# Patient Record
Sex: Female | Born: 1975 | Race: Black or African American | Hispanic: No | Marital: Single | Smoking: Current every day smoker
Health system: Southern US, Community
[De-identification: ages and names within clinical notes are randomized; demographics above are authoritative.]

## PROBLEM LIST (undated history)

## (undated) DIAGNOSIS — F419 Anxiety disorder, unspecified: Secondary | ICD-10-CM

## (undated) DIAGNOSIS — I10 Essential (primary) hypertension: Secondary | ICD-10-CM

---

## 2020-05-06 ENCOUNTER — Emergency Department (HOSPITAL_COMMUNITY)
Admission: EM | Admit: 2020-05-06 | Discharge: 2020-05-06 | Disposition: A | Discharge status: Home / Self Care | Payer: Self-pay | Attending: Emergency Medicine | Admitting: Emergency Medicine

## 2020-05-06 ENCOUNTER — Other Ambulatory Visit: Payer: Self-pay

## 2020-05-06 DIAGNOSIS — U071 COVID-19: Secondary | ICD-10-CM | POA: Insufficient documentation

## 2020-05-06 DIAGNOSIS — Z5321 Procedure and treatment not carried out due to patient leaving prior to being seen by health care provider: Secondary | ICD-10-CM | POA: Insufficient documentation

## 2020-05-06 LAB — SARS CORONAVIRUS 2 BY RT PCR (HOSPITAL ORDER, PERFORMED IN ~~LOC~~ HOSPITAL LAB): SARS Coronavirus 2: POSITIVE — AB

## 2020-05-06 MED ORDER — ACETAMINOPHEN 325 MG PO TABS
650.0000 mg | ORAL_TABLET | Freq: Once | ORAL | Status: AC
  Administered 2020-05-06: 650 mg via ORAL
  Filled 2020-05-06: qty 2

## 2020-05-06 NOTE — ED Triage Notes (Addendum)
Pt presents to ED POV. Pt c/o fever, bodyaches, HA. Pt reports that she does not have any known covid exposures but went to [place] 2w ago. Pt is not vaccinated for covid. Pt noncompliant w/ bp meds

## 2020-11-30 ENCOUNTER — Emergency Department (HOSPITAL_COMMUNITY)
Admission: EM | Admit: 2020-11-30 | Discharge: 2020-12-01 | Disposition: A | Discharge status: Home / Self Care | Payer: Self-pay | Attending: Emergency Medicine | Admitting: Emergency Medicine

## 2020-11-30 ENCOUNTER — Other Ambulatory Visit: Payer: Self-pay

## 2020-11-30 ENCOUNTER — Encounter (HOSPITAL_COMMUNITY): Payer: Self-pay

## 2020-11-30 DIAGNOSIS — R220 Localized swelling, mass and lump, head: Secondary | ICD-10-CM | POA: Insufficient documentation

## 2020-11-30 DIAGNOSIS — Z5321 Procedure and treatment not carried out due to patient leaving prior to being seen by health care provider: Secondary | ICD-10-CM | POA: Insufficient documentation

## 2020-11-30 NOTE — ED Triage Notes (Signed)
Pt c/o swelling to top lip and bottom lip on the right side. Pt states this happens every few years for her, but that the welling is worse this time. Pt denies SOB, denies trouble swallowing.

## 2021-06-15 ENCOUNTER — Other Ambulatory Visit: Payer: Self-pay

## 2021-06-15 ENCOUNTER — Encounter (HOSPITAL_BASED_OUTPATIENT_CLINIC_OR_DEPARTMENT_OTHER): Payer: Self-pay | Admitting: Emergency Medicine

## 2021-06-15 ENCOUNTER — Emergency Department (HOSPITAL_BASED_OUTPATIENT_CLINIC_OR_DEPARTMENT_OTHER)
Admission: EM | Admit: 2021-06-15 | Discharge: 2021-06-15 | Disposition: A | Discharge status: Home / Self Care | Payer: Self-pay | Attending: Emergency Medicine | Admitting: Emergency Medicine

## 2021-06-15 ENCOUNTER — Emergency Department (HOSPITAL_BASED_OUTPATIENT_CLINIC_OR_DEPARTMENT_OTHER): Payer: Self-pay

## 2021-06-15 DIAGNOSIS — M542 Cervicalgia: Secondary | ICD-10-CM | POA: Insufficient documentation

## 2021-06-15 DIAGNOSIS — R202 Paresthesia of skin: Secondary | ICD-10-CM | POA: Insufficient documentation

## 2021-06-15 DIAGNOSIS — R519 Headache, unspecified: Secondary | ICD-10-CM | POA: Insufficient documentation

## 2021-06-15 DIAGNOSIS — Z20822 Contact with and (suspected) exposure to covid-19: Secondary | ICD-10-CM | POA: Insufficient documentation

## 2021-06-15 DIAGNOSIS — R079 Chest pain, unspecified: Secondary | ICD-10-CM | POA: Insufficient documentation

## 2021-06-15 DIAGNOSIS — I10 Essential (primary) hypertension: Secondary | ICD-10-CM | POA: Insufficient documentation

## 2021-06-15 DIAGNOSIS — M79601 Pain in right arm: Secondary | ICD-10-CM | POA: Insufficient documentation

## 2021-06-15 HISTORY — DX: Essential (primary) hypertension: I10

## 2021-06-15 HISTORY — DX: Anxiety disorder, unspecified: F41.9

## 2021-06-15 LAB — COMPREHENSIVE METABOLIC PANEL
ALT: 19 U/L (ref 0–44)
AST: 20 U/L (ref 15–41)
Albumin: 4.3 g/dL (ref 3.5–5.0)
Alkaline Phosphatase: 65 U/L (ref 38–126)
Anion gap: 9 (ref 5–15)
BUN: 9 mg/dL (ref 6–20)
CO2: 27 mmol/L (ref 22–32)
Calcium: 9 mg/dL (ref 8.9–10.3)
Chloride: 98 mmol/L (ref 98–111)
Creatinine, Ser: 0.79 mg/dL (ref 0.44–1.00)
GFR, Estimated: >60 mL/min (ref >60)
Glucose, Bld: 97 mg/dL (ref 70–99)
Potassium: 3.5 mmol/L (ref 3.5–5.1)
Sodium: 134 mmol/L — ABNORMAL LOW (ref 135–145)
Total Bilirubin: 0.9 mg/dL (ref 0.3–1.2)
Total Protein: 8 g/dL (ref 6.5–8.1)

## 2021-06-15 LAB — TROPONIN I (HIGH SENSITIVITY)
Troponin I (High Sensitivity): 4 ng/L (ref <18)
Troponin I (High Sensitivity): 4 ng/L (ref <18)

## 2021-06-15 LAB — CBC
HCT: 42.2 % (ref 36.0–46.0)
Hemoglobin: 14.3 g/dL (ref 12.0–15.0)
MCH: 31.8 pg (ref 26.0–34.0)
MCHC: 33.9 g/dL (ref 30.0–36.0)
MCV: 94 fL (ref 80.0–100.0)
Platelets: 213 10*3/uL (ref 150–400)
RBC: 4.49 MIL/uL (ref 3.87–5.11)
RDW: 11.7 % (ref 11.5–15.5)
WBC: 8.3 10*3/uL (ref 4.0–10.5)
nRBC: 0 % (ref 0.0–0.2)

## 2021-06-15 LAB — RESP PANEL BY RT-PCR (FLU A&B, COVID) ARPGX2
Influenza A by PCR: NEGATIVE
Influenza B by PCR: NEGATIVE
SARS Coronavirus 2 by RT PCR: NEGATIVE

## 2021-06-15 MED ORDER — ONDANSETRON HCL 4 MG/2ML IJ SOLN
4.0000 mg | Freq: Once | INTRAMUSCULAR | Status: AC
  Administered 2021-06-15: 4 mg via INTRAVENOUS
  Filled 2021-06-15: qty 2

## 2021-06-15 MED ORDER — SODIUM CHLORIDE 0.9 % IV BOLUS
1000.0000 mL | Freq: Once | INTRAVENOUS | Status: AC
  Administered 2021-06-15: 1000 mL via INTRAVENOUS

## 2021-06-15 MED ORDER — ACETAMINOPHEN 325 MG PO TABS
650.0000 mg | ORAL_TABLET | Freq: Once | ORAL | Status: AC
  Administered 2021-06-15: 650 mg via ORAL
  Filled 2021-06-15: qty 2

## 2021-06-15 NOTE — ED Triage Notes (Signed)
Right shoulder pain and numbness with radiation down right arm x 1 week. Pt reports BP at work was [id number] systolic. CP and nausea began on the drive here. Hx of htn and anxiety. ?

## 2021-06-15 NOTE — ED Notes (Signed)
Per MD, second troponin draw held for now, will speak to pt to determine need. ?

## 2021-06-15 NOTE — ED Provider Notes (Signed)
?[facility] EMERGENCY DEPARTMENT ?Provider Note ? ? ?CSN: [id number] ?Arrival date & time: 06/15/21  1718 ? ?  ? ?History ? ?Chief Complaint  ?Patient presents with  ? Shoulder Pain  ? Numbness  ? ? ?Claire Jacobs is a 46 y.o. female. ? ?HPI ? ?46 year old female past medical history of HTN, anxiety, migraines presents emergency department with right-sided neck and upper extremity pain as well as an episode of chest pain.  Patient states for the last week she has been having right-sided neck pain that is radiating down into her right arm.  It feels like shocklike pain going down the arm.  Specifically her thumb index and middle finger feel tingly at times.  Otherwise she denies any grip strength weakness or arm weakness.  No rash.  No neck injury, no trauma to the upper extremity.  While driving here she had an episode of chest pain and felt anxious.  Currently denies any chest pain, back pain, shortness of breath.  No swelling of her lower extremities.  Patient just moved here and currently does not have a primary doctor.  She did have a headache yesterday leading into today, no history of complicated migraines. ? ?Home Medications ?Prior to Admission medications   ?Not on File  ?   ? ?Allergies    ?Patient has no known allergies.   ? ?Review of Systems   ?Review of Systems  ?Constitutional:  Negative for fever.  ?Respiratory:  Negative for chest tightness and shortness of breath.   ?Cardiovascular:  Positive for chest pain. Negative for palpitations and leg swelling.  ?Gastrointestinal:  Negative for abdominal pain, diarrhea and vomiting.  ?Musculoskeletal:  Positive for neck pain. Negative for back pain and neck stiffness.  ?     Right upper extremity radicular pain  ?Skin:  Negative for rash.  ?Neurological:  Negative for headaches.  ? ?Physical Exam ?Updated Vital Signs ?BP (!) 143/79 (BP Location: Right Arm)   Pulse 79   Temp 100 ?F (37.8 ?C) (Oral)   Resp 14   Ht 5\' 4"  (1.626 m)   Wt 79.4 kg    SpO2 99%   BMI 30.04 kg/m?  ?Physical Exam ?Vitals and nursing note reviewed.  ?Constitutional:   ?   General: She is not in acute distress. ?   Appearance: Normal appearance.  ?HENT:  ?   Head: Normocephalic.  ?   Mouth/Throat:  ?   Mouth: Mucous membranes are moist.  ?Eyes:  ?   Pupils: Pupils are equal, round, and reactive to light.  ?Cardiovascular:  ?   Rate and Rhythm: Normal rate.  ?Pulmonary:  ?   Effort: Pulmonary effort is normal. No respiratory distress.  ?Abdominal:  ?   Palpations: Abdomen is soft.  ?   Tenderness: There is no abdominal tenderness.  ?Musculoskeletal:  ?   Cervical back: No rigidity.  ?   Comments: Equal palpable radial pulses, no discoloration of the right upper extremity, no midline cervical spine tenderness to palpation, grip strength is equal and intact  ?Skin: ?   General: Skin is warm.  ?Neurological:  ?   Mental Status: She is alert and oriented to person, place, and time. Mental status is at baseline.  ?Psychiatric:     ?   Mood and Affect: Mood normal.  ? ? ?ED Results / Procedures / Treatments   ?Labs ?(all labs ordered are listed, but only abnormal results are displayed) ?Labs Reviewed  ?COMPREHENSIVE METABOLIC PANEL - Abnormal; Notable  for the following components:  ?    Result Value  ? Sodium 134 (*)   ? All other components within normal limits  ?RESP PANEL BY RT-PCR (FLU A&B, COVID) ARPGX2  ?CBC  ?PREGNANCY, URINE  ?TROPONIN I (HIGH SENSITIVITY)  ?TROPONIN I (HIGH SENSITIVITY)  ? ? ?EKG ?EKG Interpretation ? ?Date/Time:  Thursday June 15 2021 17:27:08 EDT ?Ventricular Rate:  95 ?PR Interval:  148 ?QRS Duration: 72 ?QT Interval:  344 ?QTC Calculation: 432 ?R Axis:   69 ?Text Interpretation: Normal sinus rhythm Right atrial enlargement Pulmonary disease pattern Abnormal ECG No previous ECGs available Confirmed by Coralee Pesa [phone]) on 06/15/2021 8:22:35 PM ? ?Radiology ?CT Head Wo Contrast ? ?Result Date: 06/15/2021 ?CLINICAL DATA:  Sudden severe headache EXAM: CT HEAD  WITHOUT CONTRAST TECHNIQUE: Contiguous axial images were obtained from the base of the skull through the vertex without intravenous contrast. RADIATION DOSE REDUCTION: This exam was performed according to the departmental dose-optimization program which includes automated exposure control, adjustment of the mA and/or kV according to patient size and/or use of iterative reconstruction technique. COMPARISON:  None. FINDINGS: Brain: No evidence of acute infarction, hemorrhage, hydrocephalus, extra-axial collection or mass lesion/mass effect. Vascular: No hyperdense vessel or unexpected calcification. Skull: Normal. Negative for fracture or focal lesion. Sinuses/Orbits: No acute finding. Other: None. IMPRESSION: No acute intracranial abnormalities. Electronically Signed   By: Burman Nieves M.D.   On: 06/15/2021 21:20   ? ?Procedures ?Procedures  ? ? ?Medications Ordered in ED ?Medications  ?acetaminophen (TYLENOL) tablet 650 mg (650 mg Oral Given 06/15/21 1732)  ?sodium chloride 0.9 % bolus 1,000 mL ( Intravenous Stopped 06/15/21 2252)  ?ondansetron [facility]) injection 4 mg (4 mg Intravenous Given 06/15/21 2143)  ? ? ?ED Course/ Medical Decision Making/ A&P ?  ?                        ?Medical Decision Making ?Amount and/or Complexity of Data Reviewed ?Labs: ordered. ?Radiology: ordered. ? ?Risk ?OTC drugs. ?Prescription drug management. ? ? ?46 year old female presents emergency department with right-sided neck/upper extremity pain and episode of chest pain.  Slight hypertension on arrival.  Currently no chest pain, shortness of breath or back pain.  Complaining of shocklike pain going down the right upper extremity that is reproducible in certain movements.  The arm is otherwise neurovascularly intact.  No signs of acute trauma/deformity. ? ?EKG is unremarkable, troponins are negative, blood work is reassuring.  CT of the head is unremarkable.  After medications she feels improved.  Low suspicion for ACS or emergent  issue in regards to the episode of chest pain.  The right-sided neck/arm pain sounds very musculoskeletal and is reassuring on physical exam.  Headache has resolved, most likely migraine.  Low suspicion for ICH/CVA. ? ?Patient at this time appears safe and stable for discharge and close outpatient follow up. Discharge plan and strict return to ED precautions discussed, patient verbalizes understanding and agreement. ? ? ? ? ? ? ? ?Final Clinical Impression(s) / ED Diagnoses ?Final diagnoses:  ?Right arm pain  ?Chest pain, unspecified type  ? ? ?Rx / DC Orders ?ED Discharge Orders   ? ? None  ? ?  ? ? ?  ?Rozelle Logan, DO ?06/15/21 2332 ? ?

## 2021-06-15 NOTE — Discharge Instructions (Signed)
You have been seen and discharged from the emergency department.  Your cardiac work-up was normal.  Your head CT was normal.  Your blood work showed no abnormalities.  Establish care and follow-up with orthopedics in regards to the neck/arm pain, this appears to be musculoskeletal.  Follow-up with your primary provider for further evaluation and further care. Take home medications as prescribed. If you have any worsening symptoms or further concerns for your health please return to an emergency department for further evaluation. ?

## 2021-06-15 NOTE — ED Notes (Signed)
Pt given graham crackers and water per requested OK per RN Shawn. ?

## 2021-06-15 NOTE — ED Notes (Signed)
Ambulated to bathroom with steady gait

## 2021-06-15 NOTE — ED Notes (Signed)
Pt states was prescribed b/p meds but has not taken them in 6 months.  Has recently moved to area and has not been able to establish with pmd ?

## 2021-08-07 ENCOUNTER — Emergency Department (HOSPITAL_COMMUNITY): Payer: 59

## 2021-08-07 ENCOUNTER — Encounter (HOSPITAL_COMMUNITY): Payer: Self-pay | Admitting: Emergency Medicine

## 2021-08-07 ENCOUNTER — Emergency Department (HOSPITAL_COMMUNITY)
Admission: EM | Admit: 2021-08-07 | Discharge: 2021-08-07 | Disposition: A | Discharge status: Home / Self Care | Payer: 59 | Attending: Emergency Medicine | Admitting: Emergency Medicine

## 2021-08-07 ENCOUNTER — Other Ambulatory Visit: Payer: Self-pay

## 2021-08-07 DIAGNOSIS — M25511 Pain in right shoulder: Secondary | ICD-10-CM | POA: Insufficient documentation

## 2021-08-07 DIAGNOSIS — L03113 Cellulitis of right upper limb: Secondary | ICD-10-CM | POA: Insufficient documentation

## 2021-08-07 DIAGNOSIS — I10 Essential (primary) hypertension: Secondary | ICD-10-CM | POA: Diagnosis not present

## 2021-08-07 DIAGNOSIS — M7989 Other specified soft tissue disorders: Secondary | ICD-10-CM | POA: Diagnosis not present

## 2021-08-07 DIAGNOSIS — L539 Erythematous condition, unspecified: Secondary | ICD-10-CM | POA: Insufficient documentation

## 2021-08-07 DIAGNOSIS — M25611 Stiffness of right shoulder, not elsewhere classified: Secondary | ICD-10-CM | POA: Diagnosis not present

## 2021-08-07 LAB — CBC WITH DIFFERENTIAL/PLATELET
Abs Immature Granulocytes: 0.02 10*3/uL (ref 0.00–0.07)
Basophils Absolute: 0.1 10*3/uL (ref 0.0–0.1)
Basophils Relative: 1 %
Eosinophils Absolute: 0.2 10*3/uL (ref 0.0–0.5)
Eosinophils Relative: 2 %
HCT: 40 % (ref 36.0–46.0)
Hemoglobin: 13.7 g/dL (ref 12.0–15.0)
Immature Granulocytes: 0 %
Lymphocytes Relative: 33 %
Lymphs Abs: 3.2 10*3/uL (ref 0.7–4.0)
MCH: 32.7 pg (ref 26.0–34.0)
MCHC: 34.3 g/dL (ref 30.0–36.0)
MCV: 95.5 fL (ref 80.0–100.0)
Monocytes Absolute: 0.9 10*3/uL (ref 0.1–1.0)
Monocytes Relative: 10 %
Neutro Abs: 5.2 10*3/uL (ref 1.7–7.7)
Neutrophils Relative %: 54 %
Platelets: 260 10*3/uL (ref 150–400)
RBC: 4.19 MIL/uL (ref 3.87–5.11)
RDW: 12.4 % (ref 11.5–15.5)
WBC: 9.6 10*3/uL (ref 4.0–10.5)
nRBC: 0 % (ref 0.0–0.2)

## 2021-08-07 LAB — BASIC METABOLIC PANEL
Anion gap: 6 (ref 5–15)
BUN: 11 mg/dL (ref 6–20)
CO2: 28 mmol/L (ref 22–32)
Calcium: 8.9 mg/dL (ref 8.9–10.3)
Chloride: 103 mmol/L (ref 98–111)
Creatinine, Ser: 0.78 mg/dL (ref 0.44–1.00)
GFR, Estimated: >60 mL/min (ref >60)
Glucose, Bld: 102 mg/dL — ABNORMAL HIGH (ref 70–99)
Potassium: 3.2 mmol/L — ABNORMAL LOW (ref 3.5–5.1)
Sodium: 137 mmol/L (ref 135–145)

## 2021-08-07 LAB — URINALYSIS, ROUTINE W REFLEX MICROSCOPIC
Bilirubin Urine: NEGATIVE
Glucose, UA: NEGATIVE mg/dL
Hgb urine dipstick: NEGATIVE
Ketones, ur: NEGATIVE mg/dL
Leukocytes,Ua: NEGATIVE
Nitrite: NEGATIVE
Protein, ur: NEGATIVE mg/dL
Specific Gravity, Urine: <=1.005 (ref 1.005–1.030)
pH: 5 (ref 5.0–8.0)

## 2021-08-07 LAB — SEDIMENTATION RATE: Sed Rate: 9 mm/hr (ref 0–22)

## 2021-08-07 LAB — C-REACTIVE PROTEIN: CRP: 1 mg/dL — ABNORMAL HIGH (ref <1.0)

## 2021-08-07 MED ORDER — IOHEXOL 300 MG/ML  SOLN
100.0000 mL | Freq: Once | INTRAMUSCULAR | Status: AC | PRN
  Administered 2021-08-07: 100 mL via INTRAVENOUS

## 2021-08-07 MED ORDER — CLINDAMYCIN HCL 300 MG PO CAPS: 300.0000 mg | ORAL_CAPSULE | Freq: Three times a day (TID) | ORAL | 0 refills | Status: DC

## 2021-08-07 MED ORDER — CLINDAMYCIN HCL 300 MG PO CAPS
300.0000 mg | ORAL_CAPSULE | Freq: Once | ORAL | Status: AC
  Administered 2021-08-07: 300 mg via ORAL
  Filled 2021-08-07: qty 1

## 2021-08-07 MED ORDER — ACETAMINOPHEN 325 MG PO TABS
650.0000 mg | ORAL_TABLET | Freq: Once | ORAL | Status: AC
  Administered 2021-08-07: 650 mg via ORAL
  Filled 2021-08-07: qty 2

## 2021-08-07 MED ORDER — PIPERACILLIN-TAZOBACTAM 3.375 G IVPB 30 MIN: 3.3750 g | Freq: Once | INTRAVENOUS | Status: DC

## 2021-08-07 MED ORDER — MORPHINE SULFATE (PF) 4 MG/ML IV SOLN
4.0000 mg | Freq: Once | INTRAVENOUS | Status: AC
  Administered 2021-08-07: 4 mg via INTRAVENOUS
  Filled 2021-08-07: qty 1

## 2021-08-07 NOTE — Discharge Instructions (Addendum)
Antibiotic has been sent to your pharmacy by the name of clindamycin.  We may take this once every 8 hours for the next week.  Always take with food and plenty of water. ? ?You may continue to manage the pain with Tylenol and ibuprofen at the same time.  These are not medications in the same class, and can be taken together. ? ?You have been provided with the [facility] office information to schedule an appointment with Dr. August Saucer.  Please call to be seen within the next 3 to 5 days for reevaluation and continued medical management. ? ?Return to the ED for new or worsening symptoms as discussed ?

## 2021-08-07 NOTE — ED Triage Notes (Signed)
Pt reports right shoulder pain x 1 month. Pt reports shoulder in swollen and hurts to touch.  ?

## 2021-08-07 NOTE — ED Notes (Signed)
I provided reinforced discharge education based off of discharge instructions. Pt acknowledged and understood my education. Pt had no further questions/concerns for provider/myself.  °

## 2021-08-07 NOTE — ED Notes (Signed)
Pt advises being unable to provide urine sample at this time, will monitor. 

## 2021-08-07 NOTE — ED Provider Notes (Signed)
?[facility] DEPT ?Provider Note ? ? ?CSN: [id number] ?Arrival date & time: 08/07/21  1633 ? ?  ? ?History ? ?Chief Complaint  ?Patient presents with  ? Shoulder Pain  ? ? ?Claire Jacobs is a 46 y.o. female with chief complaint of worsening right shoulder pain.  Seen in the ED 06/15/2021 for right arm pain.  Has had this on and off since that visit.  Has been increasing over the last 3 weeks.  Yesterday the joint started to become swollen, red, and warm to the touch.  Denies numbness and tingling of the upper extremities.  Endorses reduced range of motion of the right shoulder.  Denies chest pain, shortness of breath, fever, urinary symptoms, recent infection.  Hx of HTN, anxiety, migraines.  No Hx of gout, septic arthritis, reactive arthritis, or SLE.  No recent Hx of trauma. ? ?The history is provided by the patient and medical records.  ?Shoulder Pain ? ?  ? ?Home Medications ?Prior to Admission medications   ?Medication Sig Start Date End Date Taking? Authorizing Provider  ?clindamycin (CLEOCIN) 300 MG capsule Take 1 capsule (300 mg total) by mouth 3 (three) times daily for 7 days. X 7 days 08/07/21 08/14/21 Yes Prince Rome, PA-C  ?   ? ?Allergies    ?Patient has no known allergies.   ? ?Review of Systems   ?Review of Systems  ?Musculoskeletal:   ?     Pain and swelling of right shoulder  ?Skin:   ?     Warmth of right shoulder  ? ?Physical Exam ?Updated Vital Signs ?BP (!) 151/104 (BP Location: Left Arm)   Pulse 70   Temp 97.9 ?F (36.6 ?C) (Oral)   Resp 18   SpO2 100%  ?Physical Exam ?Vitals and nursing note reviewed.  ?Constitutional:   ?   General: She is not in acute distress. ?   Appearance: Normal appearance. She is well-developed. She is not ill-appearing or diaphoretic.  ?HENT:  ?   Head: Normocephalic and atraumatic.  ?   Nose: Nose normal.  ?Eyes:  ?   General: No scleral icterus. ?   Conjunctiva/sclera: Conjunctivae normal.  ?Cardiovascular:  ?   Rate and Rhythm:  Normal rate and regular rhythm.  ?   Pulses: Normal pulses.  ?   Heart sounds: Normal heart sounds. No murmur heard. ?Pulmonary:  ?   Effort: Pulmonary effort is normal. No respiratory distress.  ?   Breath sounds: Normal breath sounds.  ?Chest:  ?   Chest wall: No tenderness.  ?Abdominal:  ?   Palpations: Abdomen is soft.  ?   Tenderness: There is no abdominal tenderness.  ?Musculoskeletal:     ?   General: Swelling and tenderness present. No signs of injury.  ?   Right shoulder: Swelling and tenderness present. No deformity, laceration or crepitus. Decreased range of motion. Normal pulse.  ?   Left shoulder: Normal.  ?   Cervical back: Neck supple. No rigidity or tenderness.  ?   Right lower leg: No edema.  ?   Left lower leg: No edema.  ?Skin: ?   General: Skin is warm and dry.  ?   Capillary Refill: Capillary refill takes less than 2 seconds.  ?   Coloration: Skin is not jaundiced.  ?   Findings: Erythema present. No rash.  ?Neurological:  ?   Mental Status: She is alert and oriented to person, place, and time.  ?Psychiatric:     ?  Mood and Affect: Mood normal.  ? ? ?ED Results / Procedures / Treatments   ?Labs ?(all labs ordered are listed, but only abnormal results are displayed) ?Labs Reviewed  ?BASIC METABOLIC PANEL - Abnormal; Notable for the following components:  ?    Result Value  ? Potassium 3.2 (*)   ? Glucose, Bld 102 (*)   ? All other components within normal limits  ?CBC WITH DIFFERENTIAL/PLATELET  ?SEDIMENTATION RATE  ?URINALYSIS, ROUTINE W REFLEX MICROSCOPIC  ?C-REACTIVE PROTEIN  ? ? ?EKG ?None ? ?Radiology ?DG Shoulder Right ? ?Result Date: 08/07/2021 ?CLINICAL DATA:  Shoulder pain with warmth and swelling EXAM: RIGHT SHOULDER - 2+ VIEW COMPARISON:  None Available. FINDINGS: There is no evidence of fracture or dislocation. There is no evidence of arthropathy or other focal bone abnormality. Soft tissues are unremarkable. IMPRESSION: Negative. Electronically Signed   By: Donavan Foil M.D.   On:  08/07/2021 19:34  ? ?CT SHOULDER RIGHT W CONTRAST ? ?Result Date: 08/07/2021 ?CLINICAL DATA:  Right shoulder pain for 1 month. Shoulder swelling with tenderness. Soft tissue infection suspected. EXAM: CT OF THE UPPER RIGHT EXTREMITY WITH CONTRAST TECHNIQUE: Multidetector CT imaging of the right shoulder was performed according to the standard protocol following intravenous contrast administration. RADIATION DOSE REDUCTION: This exam was performed according to the departmental dose-optimization program which includes automated exposure control, adjustment of the mA and/or kV according to patient size and/or use of iterative reconstruction technique. CONTRAST:  126m OMNIPAQUE IOHEXOL 300 MG/ML  SOLN COMPARISON:  Radiographs same date FINDINGS: Bones/Joint/Cartilage No evidence of acute fracture, dislocation, bone destruction or erosive change. There are mild degenerative changes at the acromioclavicular joint. Small nonspecific shoulder joint effusion without suspicious synovial enhancement. The right clavicle and scapula appear normal. The right sternoclavicular joint appears normal. Mild cervical spondylosis without high-grade foraminal narrowing. Ligaments Suboptimally assessed by CT. Muscles and Tendons The rotator cuff musculature appears normal, without atrophy, fluid collection or abnormal enhancement. Soft tissues No periarticular fluid collection, foreign body or soft tissue emphysema. The vascular structures appear unremarkable. No axillary adenopathy. IMPRESSION: 1. No acute osseous findings or evidence of shoulder joint infection by CT. Small nonspecific shoulder joint effusion. 2. No evidence of periarticular fluid collection or inflammation. 3. The right shoulder muscles appear unremarkable. Electronically Signed   By: WRichardean SaleM.D.   On: 08/07/2021 20:54   ? ?Procedures ?Procedures  ? ? ?Medications Ordered in ED ?Medications  ?acetaminophen (TYLENOL) tablet 650 mg (650 mg Oral Given 08/07/21 1959)   ?morphine (PF) 4 MG/ML injection 4 mg (4 mg Intravenous Given 08/07/21 2023)  ?iohexol (OMNIPAQUE) 300 MG/ML solution 100 mL (100 mLs Intravenous Contrast Given 08/07/21 2026)  ?clindamycin (CLEOCIN) capsule 300 mg (300 mg Oral Given 08/07/21 2146)  ? ? ?ED Course/ Medical Decision Making/ A&P ?  ?                        ?Medical Decision Making ?Amount and/or Complexity of Data Reviewed ?External Data Reviewed: notes. ?Labs: ordered. Decision-making details documented in ED Course. ?Radiology: ordered and independent interpretation performed. Decision-making details documented in ED Course. ?ECG/medicine tests: ordered and independent interpretation performed. Decision-making details documented in ED Course. ? ?Risk ?OTC drugs. ?Prescription drug management. ? ? ?46y.o. female presents to the ED for concern of Shoulder Pain ?  ?This involves an extensive number of treatment options, and is a complaint that carries with it a high risk of complications and morbidity.  The  emergent differential diagnosis prior to evaluation includes, but is not limited to: Fracture, dislocation, contusion, effusion, septic arthritis, gout, pseudogout, reactive arthritis ? ?This is not an exhaustive differential.  ? ?Past Medical History / Co-morbidities / Social History: ?HTN, anxiety, migraines ? ?Additional History:  ?Internal and external records from outside source obtained and reviewed including ED visits ? ?Physical Exam: ?Physical exam performed. The pertinent findings include: Erythema, warmth, swelling of the right shoulder joint. ? ?Lab Tests: ?I ordered, and personally interpreted labs.  The pertinent results include:   ?CBC: Unremarkable ?CMP/BMP: Unremarkable ?CRP: Pending ?ESR: Negative ?Urinalysis: Unremarkable ? ?Imaging Studies: ?I ordered imaging studies including x-ray and CT right shoulder.  I independently visualized and interpreted said imaging.  Pertinent results described in ED course. ? ?Medications: ?I ordered  medication including Tylenol and morphine for pain relief, and clindamycin for antibiotic therapy.  Reevaluation of the patient after these medicines showed that the patient mild-moderate relief.  I have revie

## 2021-08-07 NOTE — ED Notes (Signed)
Pt advised attempting to void multiple times without success. ?

## 2021-08-10 ENCOUNTER — Telehealth (HOSPITAL_COMMUNITY): Payer: Self-pay | Admitting: Medical

## 2021-08-10 MED ORDER — CLINDAMYCIN HCL 300 MG PO CAPS: 300.0000 mg | ORAL_CAPSULE | Freq: Three times a day (TID) | ORAL | 0 refills | Status: AC

## 2021-08-10 MED ORDER — CLINDAMYCIN HCL 300 MG PO CAPS: 300.0000 mg | ORAL_CAPSULE | Freq: Three times a day (TID) | ORAL | 0 refills | Status: DC

## 2021-08-16 ENCOUNTER — Ambulatory Visit: Payer: 59 | Admitting: Surgery

## 2021-08-24 ENCOUNTER — Telehealth: Payer: Self-pay | Admitting: Orthopedic Surgery

## 2021-08-24 ENCOUNTER — Encounter: Payer: Self-pay | Admitting: Surgery

## 2021-08-24 ENCOUNTER — Ambulatory Visit (INDEPENDENT_AMBULATORY_CARE_PROVIDER_SITE_OTHER): Payer: 59

## 2021-08-24 ENCOUNTER — Ambulatory Visit (INDEPENDENT_AMBULATORY_CARE_PROVIDER_SITE_OTHER): Payer: 59 | Admitting: Surgery

## 2021-08-24 VITALS — BP 176/123 | HR 67 | Temp 98.0°F

## 2021-08-24 DIAGNOSIS — M25511 Pain in right shoulder: Secondary | ICD-10-CM

## 2021-08-24 NOTE — Telephone Encounter (Signed)
Looks like a more urgent thing, I would work her in for after the scan next week

## 2021-08-24 NOTE — Telephone Encounter (Signed)
"  Return in about 1 week (around 08/31/2021) for with dr dean to review right shoulder MRI." Dr does not have a spot open until the 21st of June does she need to be worked in or can this wait.

## 2021-08-24 NOTE — Progress Notes (Signed)
Office Visit Note   Patient: Claire Jacobs           Date of Birth: 1975/08/13           MRN: [id number] Visit Date: 08/24/2021              Requested by: No referring provider defined for this encounter. PCP: Pcp, No   Assessment & Plan: Visit Diagnoses:  1. Acute pain of right shoulder   Previous right shoulder cellulitis diagnosis in the emergency department Aug 07, 2021  Plan: I discussed patient with Dr. Marlou Sa today.  With her ongoing pain I will get an MRI right shoulder to rule out infection.  Blood work also drawn to check a CBC with differential, sed rate, arthritis panel.  Follow-up with Dr. Marlou Sa in 1 week to review studies.  Patient understands if her pain worsens over the weekend that she should again go to the emergency room for evaluation.  Blood pressure in office today 176/123.  Patient also advised to get a primary care provider.  Follow-Up Instructions: Return in about 1 week (around 08/31/2021) for with dr dean to review right shoulder MRI.   Orders:  Orders Placed This Encounter  Procedures   XR Shoulder 1V Right   MR SHOULDER RIGHT WO CONTRAST   CBC with Differential   Sed Rate (ESR)   Antinuclear Antib (ANA)   Rheumatoid Factor   Uric acid   No orders of the defined types were placed in this encounter.     Procedures: No procedures performed   Clinical Data: No additional findings.   Subjective: Chief Complaint  Patient presents with   Right Shoulder - Pain    HPI 46 year old black female who is new patient to clinic comes in today with complaints of right shoulder pain and swelling x1 month.  Patient dates that her pain was sudden onset and went to the emergency department for evaluation May 2023.  Patient was diagnosed with cellulitis and treated with clindamycin.  CT right shoulder at the ED showed:  CLINICAL DATA:  Right shoulder pain for 1 month. Shoulder swelling with tenderness. Soft tissue infection suspected.   EXAM: CT OF THE  UPPER RIGHT EXTREMITY WITH CONTRAST   TECHNIQUE: Multidetector CT imaging of the right shoulder was performed according to the standard protocol following intravenous contrast administration.   RADIATION DOSE REDUCTION: This exam was performed according to the departmental dose-optimization program which includes automated exposure control, adjustment of the mA and/or kV according to patient size and/or use of iterative reconstruction technique.   CONTRAST:  171m OMNIPAQUE IOHEXOL 300 MG/ML  SOLN   COMPARISON:  Radiographs same date   FINDINGS: Bones/Joint/Cartilage   No evidence of acute fracture, dislocation, bone destruction or erosive change. There are mild degenerative changes at the acromioclavicular joint. Small nonspecific shoulder joint effusion without suspicious synovial enhancement. The right clavicle and scapula appear normal. The right sternoclavicular joint appears normal. Mild cervical spondylosis without high-grade foraminal narrowing.   Ligaments   Suboptimally assessed by CT.   Muscles and Tendons   The rotator cuff musculature appears normal, without atrophy, fluid collection or abnormal enhancement.   Soft tissues   No periarticular fluid collection, foreign body or soft tissue emphysema. The vascular structures appear unremarkable. No axillary adenopathy.   IMPRESSION: 1. No acute osseous findings or evidence of shoulder joint infection by CT. Small nonspecific shoulder joint effusion. 2. No evidence of periarticular fluid collection or inflammation. 3. The right shoulder muscles  appear unremarkable.     Electronically Signed   By: Richardean Sale M.D.   On: 08/07/2021 20:54  States that she continues to have pain throughout her shoulder.  Aggravated with movement.  Dr. Marlou Sa was on-call the evening she was in the ED but she made an appointment to see me on May 17 but canceled.  States that she had to work.  Denies fever chills.  No injury or  previous problems of this nature before onset.  No cervical spinal radicular component Review of Systems No current cardiopulmonary GI/GU issues  Objective: Vital Signs: BP (!) 176/123   Pulse 67   Temp 98 F (36.7 C)   SpO2 (!) 87%   Physical Exam HENT:     Head: Normocephalic and atraumatic.     Nose: Nose normal.  Eyes:     Extraocular Movements: Extraocular movements intact.  Pulmonary:     Effort: No respiratory distress.  Musculoskeletal:     Comments: Right shoulder she has good range of motion but with discomfort.  Does have swelling around the right shoulder.  No gross signs of infection.  Right shoulder is diffusely tender.  Pain with impingement testing.  Negative drop arm test.  Neurological:     General: No focal deficit present.     Mental Status: She is alert and oriented to person, place, and time.  Psychiatric:        Mood and Affect: Mood normal.    Ortho Exam  Specialty Comments:  No specialty comments available.  Imaging: No results found.   PMFS History: There are no problems to display for this patient.  Past Medical History:  Diagnosis Date   Anxiety    Hypertension     History reviewed. No pertinent family history.  History reviewed. No pertinent surgical history. Social History   Occupational History   Not on file  Tobacco Use   Smoking status: Every Day   Smokeless tobacco: Never  Substance and Sexual Activity   Alcohol use: Not on file   Drug use: Not on file   Sexual activity: Not on file

## 2021-08-24 NOTE — Telephone Encounter (Signed)
Please advise 

## 2021-08-26 LAB — CBC WITH DIFFERENTIAL/PLATELET
Absolute Monocytes: 555 cells/uL (ref 200–950)
Basophils Absolute: 102 cells/uL (ref 0–200)
Basophils Relative: 1.4 %
Eosinophils Absolute: 161 cells/uL (ref 15–500)
Eosinophils Relative: 2.2 %
HCT: 44.4 % (ref 35.0–45.0)
Hemoglobin: 14.7 g/dL (ref 11.7–15.5)
Lymphs Abs: 3526 cells/uL (ref 850–3900)
MCH: 31.7 pg (ref 27.0–33.0)
MCHC: 33.1 g/dL (ref 32.0–36.0)
MCV: 95.9 fL (ref 80.0–100.0)
MPV: 10.4 fL (ref 7.5–12.5)
Monocytes Relative: 7.6 %
Neutro Abs: 2957 cells/uL (ref 1500–7800)
Neutrophils Relative %: 40.5 %
Platelets: 320 10*3/uL (ref 140–400)
RBC: 4.63 10*6/uL (ref 3.80–5.10)
RDW: 11.8 % (ref 11.0–15.0)
Total Lymphocyte: 48.3 %
WBC: 7.3 10*3/uL (ref 3.8–10.8)

## 2021-08-26 LAB — URIC ACID: Uric Acid, Serum: 3.5 mg/dL (ref 2.5–7.0)

## 2021-08-26 LAB — ANTI-NUCLEAR AB-TITER (ANA TITER)
ANA TITER: 1:80 {titer} — ABNORMAL HIGH
ANA Titer 1: 1:80 {titer} — ABNORMAL HIGH

## 2021-08-26 LAB — ANA: Anti Nuclear Antibody (ANA): POSITIVE — AB

## 2021-08-26 LAB — SEDIMENTATION RATE: Sed Rate: 6 mm/h (ref 0–20)

## 2021-08-26 LAB — RHEUMATOID FACTOR: Rheumatoid fact SerPl-aCnc: <14 IU/mL (ref <14)

## 2021-08-29 ENCOUNTER — Ambulatory Visit: Payer: 59 | Admitting: Orthopedic Surgery

## 2021-09-17 ENCOUNTER — Ambulatory Visit (HOSPITAL_COMMUNITY)
Admission: RE | Admit: 2021-09-17 | Discharge: 2021-09-17 | Disposition: A | Discharge status: Home / Self Care | Payer: 59 | Source: Ambulatory Visit | Attending: Surgery | Admitting: Surgery

## 2021-09-17 DIAGNOSIS — M25511 Pain in right shoulder: Secondary | ICD-10-CM | POA: Insufficient documentation

## 2021-10-04 ENCOUNTER — Encounter: Payer: Self-pay | Admitting: Orthopedic Surgery

## 2021-10-04 ENCOUNTER — Ambulatory Visit: Payer: Commercial Managed Care - HMO | Admitting: Orthopedic Surgery

## 2021-10-04 DIAGNOSIS — M7551 Bursitis of right shoulder: Secondary | ICD-10-CM | POA: Diagnosis not present

## 2021-10-04 NOTE — Progress Notes (Signed)
Office Visit Note   Patient: Claire Jacobs           Date of Birth: 04-03-1975           MRN: [id number] Visit Date: 10/04/2021 Requested by: No referring provider defined for this encounter. PCP: Pcp, No  Subjective: Chief Complaint  Patient presents with   Right Shoulder - Follow-up    HPI: Claire Jacobs is a 46 year old patient with right shoulder pain for 2 to 3 months.  She had labs drawn which showed ANA of 1-80.  Pain does wake her from sleep at night.  She is right-hand dominant.  Denies any numbness and tingling or neck pain.  No radicular pain.  Does report painful range of motion.  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  She has had an MRI scan of the shoulder which is reviewed.  Nonarthrogram study shows severe tendinosis of the supraspinatus tendon moderate tendinosis of the infraspinatus tendon mild tendinosis of the intra-articular portion of the long head of the biceps and no other abnormalities.  Patient denies any other discrete joint pain.  She did try over-the-counter medications without much relief              ROS: All systems reviewed are negative as they relate to the chief complaint within the history of present illness.  Patient denies  fevers or chills.   Assessment & Plan: Visit Diagnoses:  1. Bursitis of right shoulder     Plan: Impression is right shoulder pain with fairly significant supraspinatus tendinosis.  ANA also elevated but this may or may not be significant.  Most of her pain is anterior.  Would like to try injection into the subacromial space today with 4-week return.  Decide for or against surgical bursectomy and decompression at that time as well as for or against rheumatologic referral at that time. Follow-Up Instructions: No follow-ups on file.   Orders:  No orders of the defined types were placed in this encounter.  No orders of the defined types were placed in this encounter.     Procedures: Large Joint Inj: R  subacromial bursa on 10/04/2021 7:09 AM Indications: diagnostic evaluation and pain Details: 18 G 1.5 in needle, posterior approach  Arthrogram: No  Medications: 9 mL bupivacaine 0.5 %; 40 mg methylPREDNISolone acetate 40 MG/ML; 5 mL lidocaine 1 % Outcome: tolerated well, no immediate complications Procedure, treatment alternatives, risks and benefits explained, specific risks discussed. Consent was given by the patient. Immediately prior to procedure a time out was called to verify the correct patient, procedure, equipment, support staff and site/side marked as required. Patient was prepped and draped in the usual sterile fashion.       Clinical Data: No additional findings.  Objective: Vital Signs: There were no vitals taken for this visit.  Physical Exam:   Constitutional: Patient appears well-developed HEENT:  Head: Normocephalic Eyes:EOM are normal Neck: Normal range of motion Cardiovascular: Normal rate Pulmonary/chest: Effort normal Neurologic: Patient is alert Skin: Skin is warm Psychiatric: Patient has normal mood and affect   Ortho Exam: Ortho exam demonstrates good cervical spine range of motion with flexion extension rotation normal.  She has 5 out of 5 grip EPL FPL interosseous resection extension bicep triceps and deltoid strength.  No real synovitis or warmth in any of her upper extremity joints in the hand elbow or shoulder.  No AC joint asymmetric tenderness or swelling.  She has passive range of motion of  60/95/170.  No coarse grinding or crepitus on the left-hand side or right-hand side with internal/external rotation at 90 degrees of abduction.  O'Brien's testing is negative bilaterally.  Speeds testing equivocal on the right negative on the left.  No restriction of external rotation passively asymmetrically on the right-hand side.  No scapular dyskinesia on the right.  Specialty Comments:  No specialty comments available.  Imaging: No results  found.   PMFS History: There are no problems to display for this patient.  Past Medical History:  Diagnosis Date   Anxiety    Hypertension     History reviewed. No pertinent family history.  History reviewed. No pertinent surgical history. Social History   Occupational History   Not on file  Tobacco Use   Smoking status: Every Day   Smokeless tobacco: Never  Substance and Sexual Activity   Alcohol use: Not on file   Drug use: Not on file   Sexual activity: Not on file

## 2021-10-06 MED ORDER — METHYLPREDNISOLONE ACETATE 40 MG/ML IJ SUSP
40.0000 mg | INTRAMUSCULAR | Status: AC | PRN
  Administered 2021-10-04: 40 mg via INTRA_ARTICULAR

## 2021-10-06 MED ORDER — BUPIVACAINE HCL 0.5 % IJ SOLN
9.0000 mL | INTRAMUSCULAR | Status: AC | PRN
  Administered 2021-10-04: 9 mL via INTRA_ARTICULAR

## 2021-10-06 MED ORDER — LIDOCAINE HCL 1 % IJ SOLN
5.0000 mL | INTRAMUSCULAR | Status: AC | PRN
  Administered 2021-10-04: 5 mL

## 2021-12-25 ENCOUNTER — Emergency Department (HOSPITAL_COMMUNITY)
Admission: EM | Admit: 2021-12-25 | Discharge: 2021-12-25 | Disposition: A | Discharge status: Home / Self Care | Payer: Commercial Managed Care - HMO | Attending: Emergency Medicine | Admitting: Emergency Medicine

## 2021-12-25 ENCOUNTER — Other Ambulatory Visit: Payer: Self-pay

## 2021-12-25 ENCOUNTER — Encounter (HOSPITAL_COMMUNITY): Payer: Self-pay

## 2021-12-25 DIAGNOSIS — I10 Essential (primary) hypertension: Secondary | ICD-10-CM | POA: Diagnosis not present

## 2021-12-25 DIAGNOSIS — U071 COVID-19: Secondary | ICD-10-CM | POA: Diagnosis not present

## 2021-12-25 DIAGNOSIS — R059 Cough, unspecified: Secondary | ICD-10-CM | POA: Diagnosis present

## 2021-12-25 DIAGNOSIS — R030 Elevated blood-pressure reading, without diagnosis of hypertension: Secondary | ICD-10-CM

## 2021-12-25 LAB — RESP PANEL BY RT-PCR (FLU A&B, COVID) ARPGX2
Influenza A by PCR: NEGATIVE
Influenza B by PCR: NEGATIVE
SARS Coronavirus 2 by RT PCR: POSITIVE — AB

## 2021-12-25 MED ORDER — IBUPROFEN 200 MG PO TABS
600.0000 mg | ORAL_TABLET | Freq: Once | ORAL | Status: AC
  Administered 2021-12-25: 600 mg via ORAL
  Filled 2021-12-25: qty 3

## 2021-12-25 MED ORDER — ACETAMINOPHEN 500 MG PO TABS
1000.0000 mg | ORAL_TABLET | Freq: Once | ORAL | Status: AC
  Administered 2021-12-25: 1000 mg via ORAL
  Filled 2021-12-25: qty 2

## 2021-12-25 NOTE — ED Triage Notes (Signed)
Pt reports with a fever, cough, and headache since yesterday. Pt states that she was at a party on Saturday.

## 2021-12-25 NOTE — Discharge Instructions (Addendum)
You were seen in the ER for evaluation of your cough and cold symptoms. You tested positive for COVID. You will need to quarantine as to not spread the virus. You can take OTC cough and cold medications like Alka Seltzer for relief of symptoms. You can also take Tylneol or ibruprofen as long as that medicaiton IS NOT in the cough and cold medication you are taking. Additionally, I have listed the information for a PCP to this discharge paperwork for you to follow up with for your BP medications. If you have any concerns, new or worsening symptoms, please return to the ER.   Get help right away if: You have trouble breathing. You have pain or pressure in your chest. You are confused. You have bluish lips and fingernails. You have trouble waking from sleep. You have symptoms that get worse. These symptoms may be an emergency. Get help right away. Call 911. Do not wait to see if the symptoms will go away. Do not drive yourself to the hospital.

## 2021-12-25 NOTE — ED Provider Notes (Signed)
[facility]-EMERGENCY DEPT Provider Note   CSN: [id number] Arrival date & time: 12/25/21  4098     History Chief Complaint  Patient presents with   Fever   Cough   Headache    Claire Jacobs is a 46 y.o. female history of noncompliant hypertension presents emergency department for evaluation of cough, runny nose, nasal congestion, nausea, body aches, and frontal headache since yesterday morning.  Patient denies any known COVID contacts, however she was at a party this weekend.  She has not taken any medications for symptoms.  She denies any visual changes, neck stiffness, vomiting, diarrhea, abdominal pain, sore throat.  She reports generalized fatigue.  She reports that she has not taken her medication in quite some time due to her not having a primary care doctor.  No daily medications.  No known drug allergies.  Daily marijuana user.   Fever Associated symptoms: congestion, cough, headaches, myalgias, nausea and rhinorrhea   Associated symptoms: no chest pain, no chills, no sore throat and no vomiting   Cough Associated symptoms: fever, headaches, myalgias and rhinorrhea   Associated symptoms: no chest pain, no chills, no shortness of breath and no sore throat   Headache Associated symptoms: congestion, cough, fatigue, fever, myalgias and nausea   Associated symptoms: no dizziness, no neck pain, no photophobia, no sore throat and no vomiting        Home Medications Prior to Admission medications   Not on File      Allergies    Patient has no known allergies.    Review of Systems   Review of Systems  Constitutional:  Positive for fatigue and fever. Negative for chills.  HENT:  Positive for congestion and rhinorrhea. Negative for sore throat.   Eyes:  Negative for photophobia and visual disturbance.  Respiratory:  Positive for cough. Negative for shortness of breath.   Cardiovascular:  Negative for chest pain.  Gastrointestinal:  Positive for  nausea. Negative for vomiting.  Musculoskeletal:  Positive for myalgias. Negative for neck pain.  Neurological:  Positive for headaches. Negative for dizziness, syncope and light-headedness.    Physical Exam Updated Vital Signs BP (!) 175/98 (BP Location: Right Arm)   Pulse 88   Temp 99.6 F (37.6 C) (Oral)   Resp 16   SpO2 100%  Physical Exam Vitals and nursing note reviewed.  Constitutional:      General: She is not in acute distress.    Appearance: She is not ill-appearing or toxic-appearing.  HENT:     Head: Normocephalic and atraumatic.     Right Ear: Tympanic membrane, ear canal and external ear normal.     Left Ear: Tympanic membrane, ear canal and external ear normal.     Nose:     Comments: Bilateral nasal turbinate edema and erythema with scant clear nasal discharge.    Mouth/Throat:     Mouth: Mucous membranes are moist.     Comments: No pharyngeal erythema, exudate, or edema noted.  Uvula midline.  Airway patent.  Moist mucous membranes. Eyes:     General: No scleral icterus.    Extraocular Movements:     Right eye: Normal extraocular motion.     Left eye: Normal extraocular motion.     Conjunctiva/sclera: Conjunctivae normal.  Neck:     Comments: Full range of motion without pain. Cardiovascular:     Rate and Rhythm: Normal rate.  Pulmonary:     Effort: Pulmonary effort is normal. No respiratory distress.  Breath sounds: Normal breath sounds.  Abdominal:     Palpations: Abdomen is soft.     Tenderness: There is no abdominal tenderness. There is no guarding or rebound.  Musculoskeletal:        General: No deformity.     Cervical back: Normal range of motion and neck supple. No rigidity.  Lymphadenopathy:     Cervical: No cervical adenopathy.  Skin:    Findings: No rash.  Neurological:     General: No focal deficit present.     Mental Status: She is alert.     GCS: GCS eye subscore is 4. GCS verbal subscore is 5. GCS motor subscore is 6.     Cranial  Nerves: No cranial nerve deficit, dysarthria or facial asymmetry.     Motor: No weakness.     Gait: Gait normal.     Comments: Patient moving all extremities with ease.  Cranial nerves II through XII intact.  Patient answering questions appropriate appropriate speech.  No facial droop noted.     ED Results / Procedures / Treatments   Labs (all labs ordered are listed, but only abnormal results are displayed) Labs Reviewed  RESP PANEL BY RT-PCR (FLU A&B, COVID) ARPGX2 - Abnormal; Notable for the following components:      Result Value   SARS Coronavirus 2 by RT PCR POSITIVE (*)    All other components within normal limits    EKG None  Radiology No results found.  Procedures Procedures   Medications Ordered in ED Medications  ibuprofen (ADVIL) tablet 600 mg (600 mg Oral Given 12/25/21 1250)  acetaminophen (TYLENOL) tablet 1,000 mg (1,000 mg Oral Given 12/25/21 1251)    ED Course/ Medical Decision Making/ A&P                           Medical Decision Making Risk OTC drugs.   46 y/o F presents to the Emergency Department for evaluation of cough and cold symptoms.  Differential diagnosis includes was not limited to bronchitis, pneumonia, viral illness, COVID, flu, meningitis, subarachnoid hemorrhage.  Vital signs show elevated blood pressure, slightly elevated temperature 9.6 although not fever criteria, normal heart rate, satting well on room air with any increased work of breathing.  Physical exam as noted above.  Patient is sitting comfortably.  Lung sounds clear to auscultation.  Patient is speaking in full sentence with ease and is in no acute distress.  She is moving all extremities.  Normal gait.  She denies that this is the worst headache of her life.  I independently reviewed and interpreted the patient's labs.  COVID-positive, negative for flu.  Patient's symptoms likely due to her COVID-positive status.  I doubt any bronchitis or pneumonia as the patient has clear  lung sounds and is satting well on room air.  Doubt any meningitis the patient has full range of motion of her head again and vital signs are stable.  Doubt any subarachnoid hemorrhage because again patient denies is worse headache of her life and vital signs are stable.  I discussed the patient's COVID-positive status with her.  Recommended COVID quarantine.  Work note given.  This patient is on a candidate for Paxlovid as she does not have any preceding conditions of than hypertension.  Recommended cough and cold medications as well as using Tylenol ibuprofen.  Discussed possible overdose on these medications and to be wary of drug labels.  We discussed staying well-hydrated with plenty of  fluids.  Referral for [facility] and wellness given for patient to follow-up with her blood pressure.  We discussed return precautions  and red flag symptoms.  Patient verbalized understanding agrees the plan.  Patient is stable be discharged home in good condition.   Final Clinical Impression(s) / ED Diagnoses Final diagnoses:  COVID  Elevated blood pressure reading    Rx / DC Orders ED Discharge Orders     None         Achille Rich, PA-C 12/25/21 1340    Gloris Manchester, MD 12/25/21 1736

## 2022-03-27 ENCOUNTER — Inpatient Hospital Stay: Admission: RE | Admit: 2022-03-27 | Payer: Self-pay | Source: Ambulatory Visit

## 2023-04-07 IMAGING — CR DG SHOULDER 2+V*R*
3 series · 3 of 3 positions shown · non-contrast
Comparison: None Available.

CLINICAL DATA: Shoulder pain with warmth and swelling

EXAM:
RIGHT SHOULDER - 2+ VIEW

[w shoulder external right]
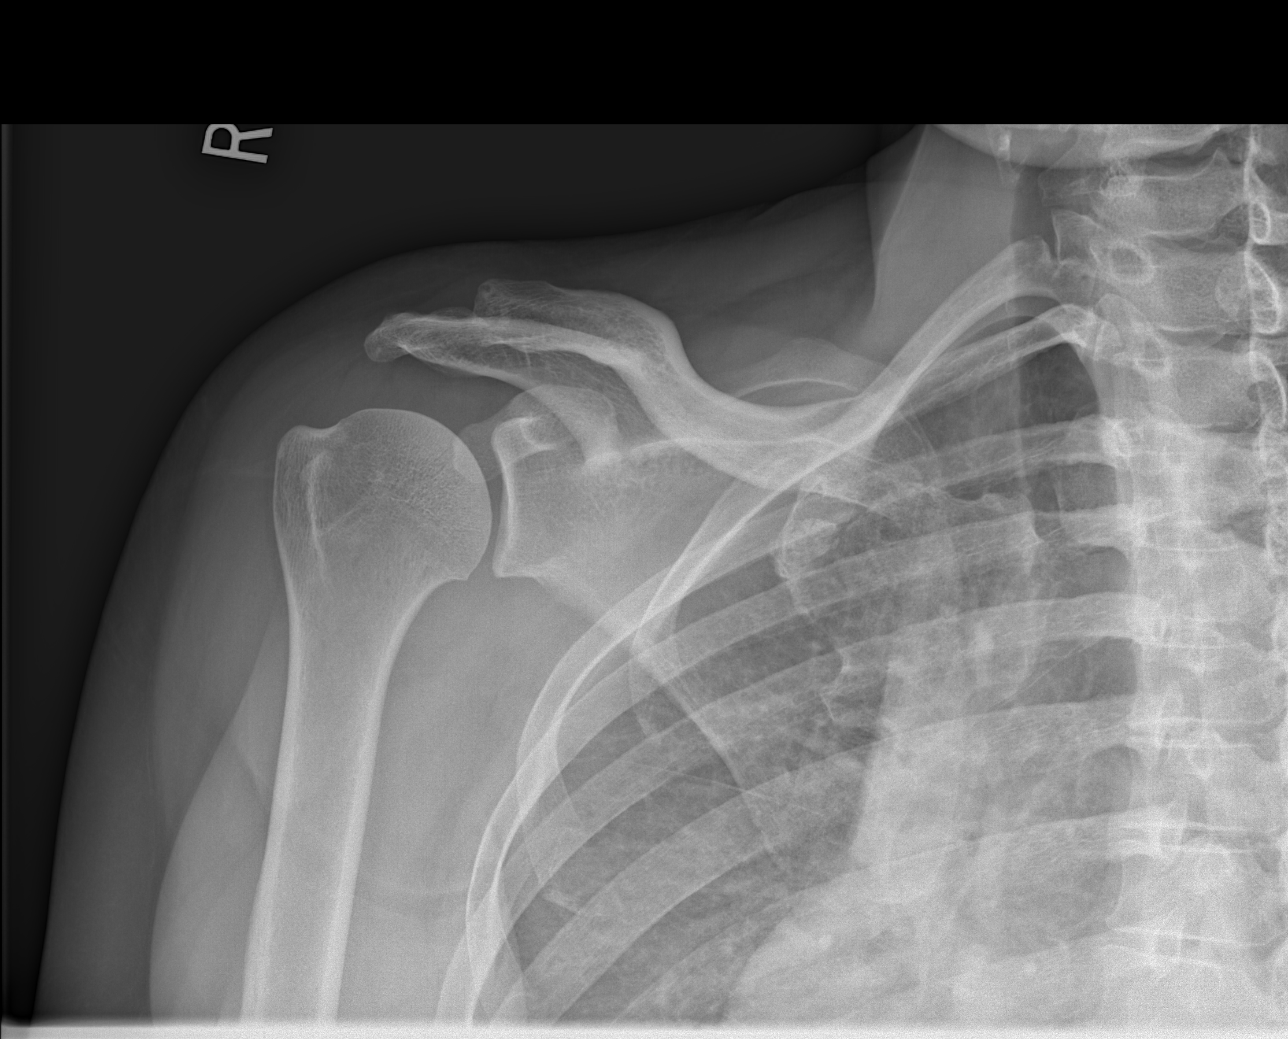

[w shoulder y-view right]
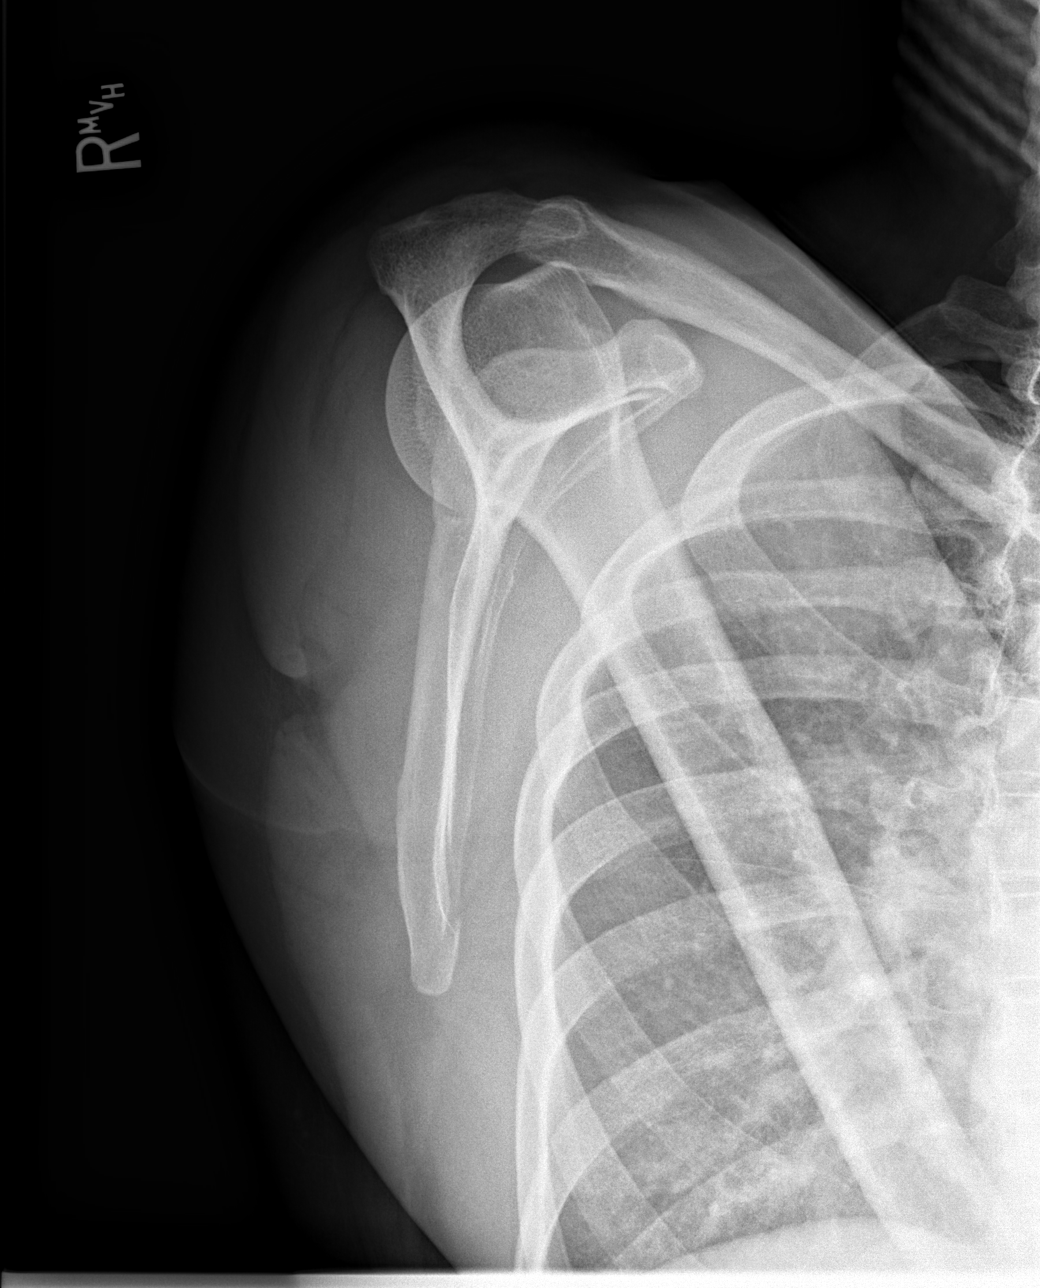

[x shoulder axillary right]
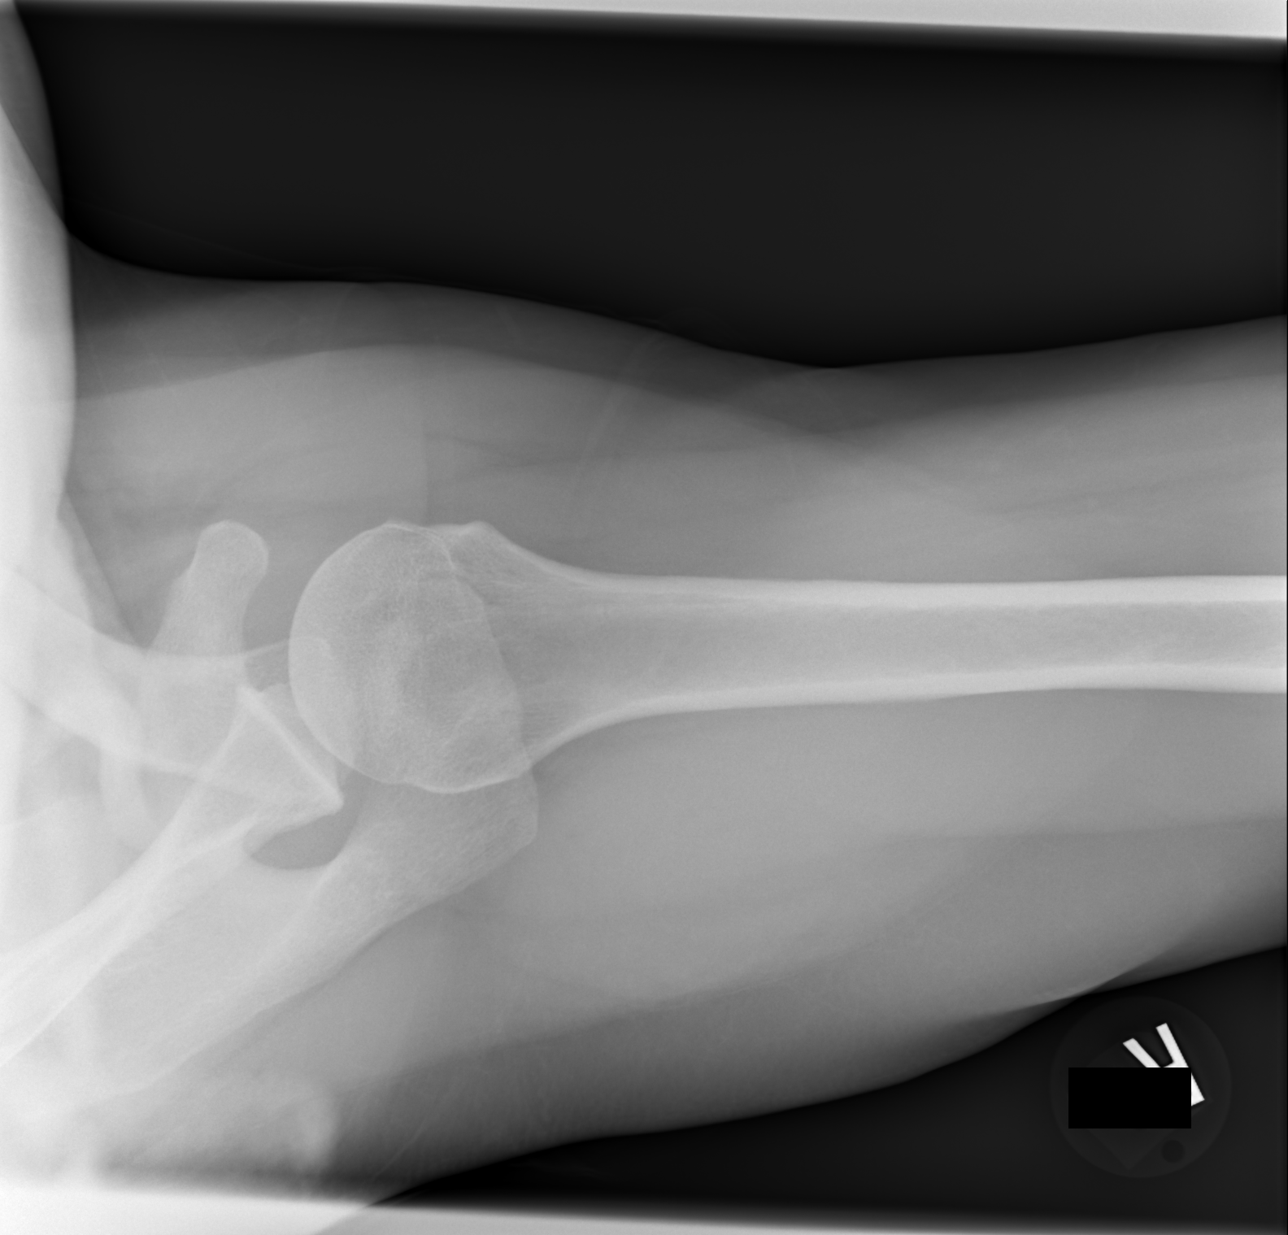

[3 of 3 positions shown; findings below may reference images not displayed]

FINDINGS: There is no evidence of fracture or dislocation. There is no
evidence of arthropathy or other focal bone abnormality. Soft
tissues are unremarkable.
IMPRESSION: Negative.

## 2023-04-07 IMAGING — CT CT SHOULDER*R* W/CM
3 of 5 series · 10 of 33 positions shown, 12 images · IV contrast (agent unspecified)
Comparison: Radiographs same date

CLINICAL DATA: Right shoulder pain for 1 month. Shoulder swelling
with tenderness. Soft tissue infection suspected.

EXAM:
CT OF THE UPPER RIGHT EXTREMITY WITH CONTRAST
TECHNIQUE: Multidetector CT imaging of the right shoulder was performed
according to the standard protocol following intravenous contrast
administration.

[Series 4: ax bone · axial · 0.45mm/px · z∈[-256,-21]mm · 2 of 119 slices shown, 3 images]
[im 1/119  soft-tissue]
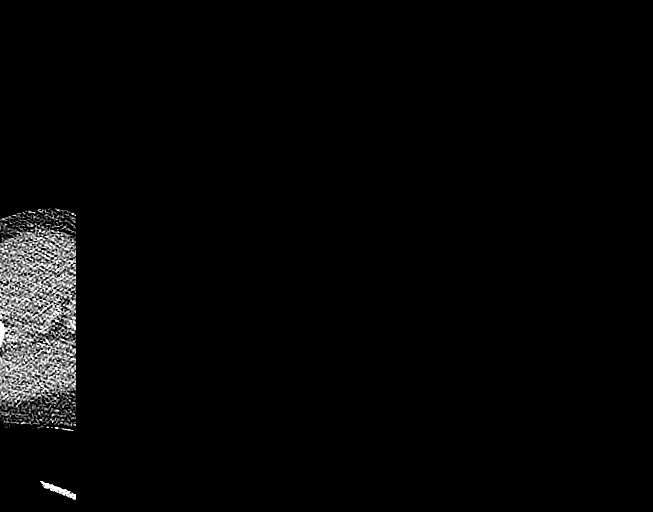
[im 1/119  bone]
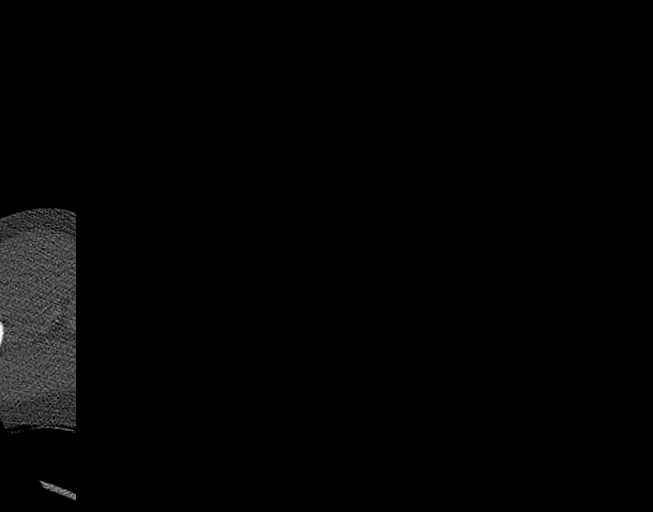
[im 119/119  bone]
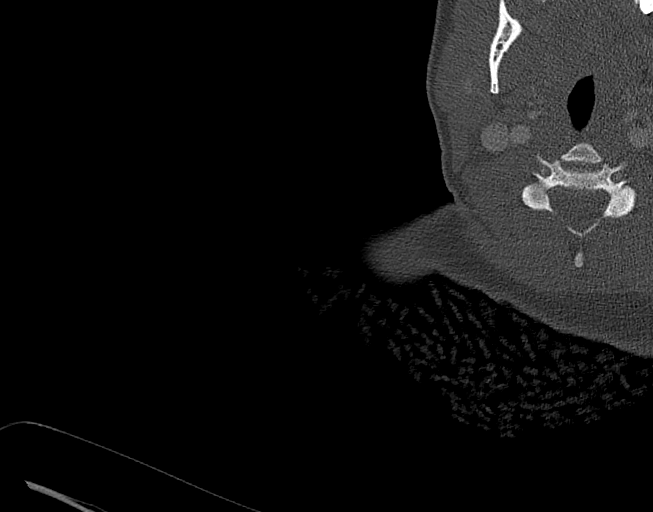

[Series 8: cor st · coronal · 0.46mm/px · 3 of 115 slices shown]
[im 23/115  bone]
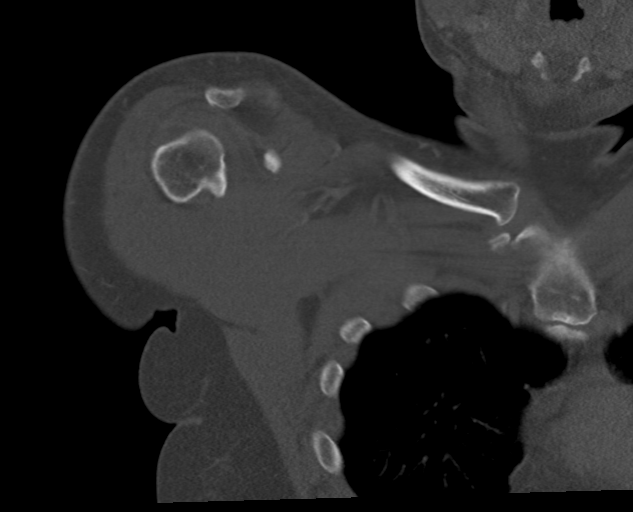
[im 46/115  bone]
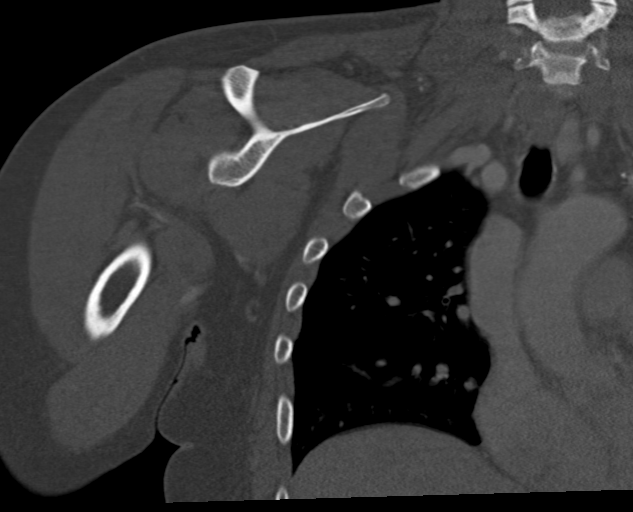
[im 69/115  bone]
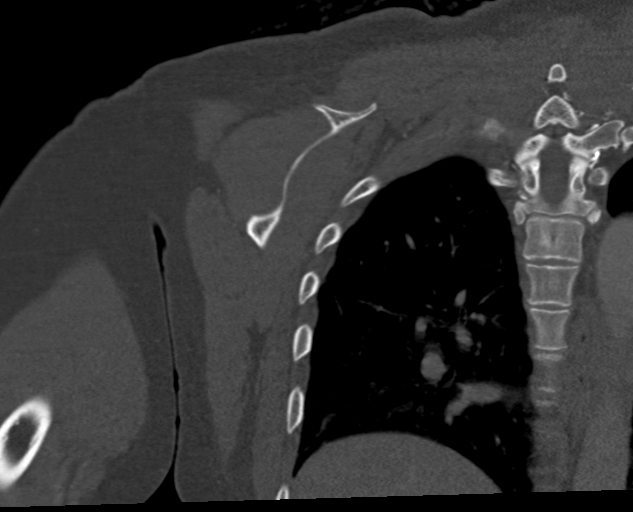

[Series 9: sag st · sagittal · 0.45mm/px · 5 of 147 slices shown, 6 images]
[im 49/147  bone]
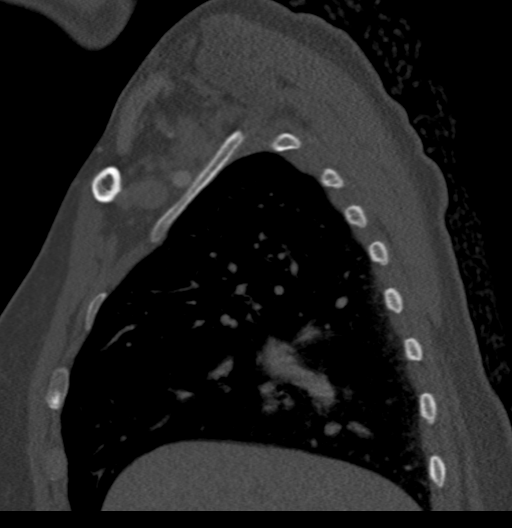
[im 61/147  bone]
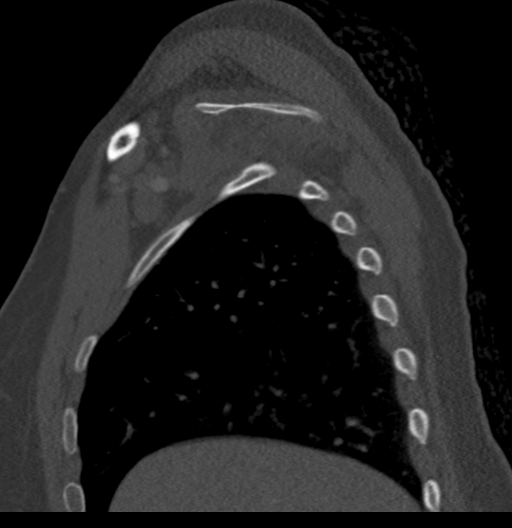
[im 74/147  soft-tissue]
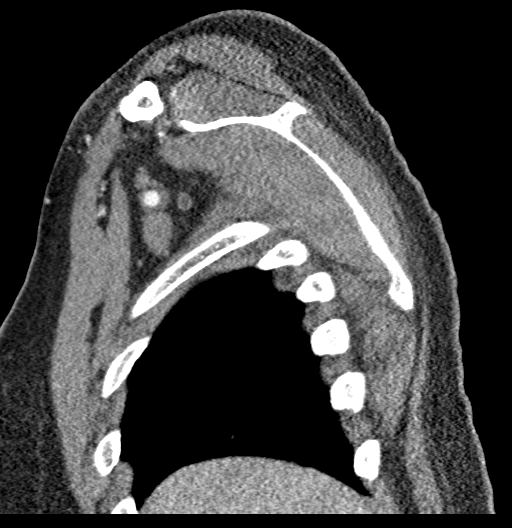
[im 74/147  bone]
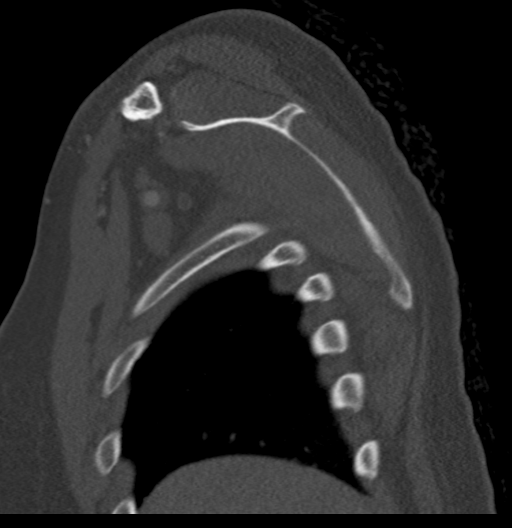
[im 86/147  bone]
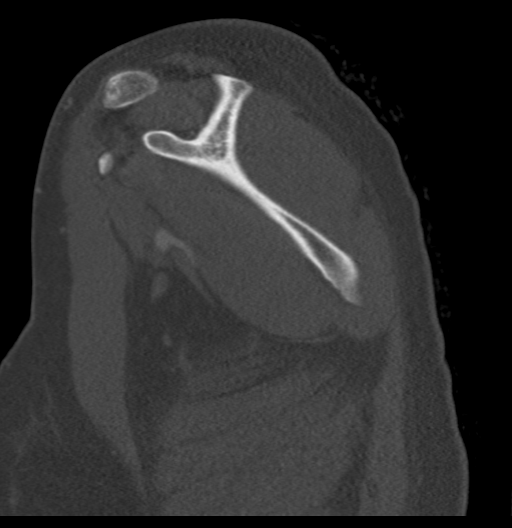
[im 98/147  bone]
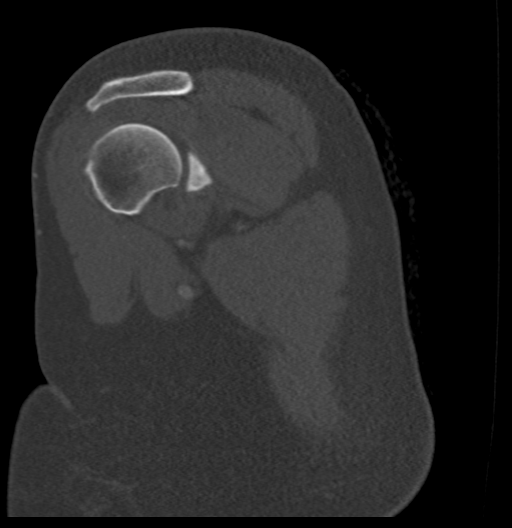

[10 of 33 positions shown; findings below may reference images not displayed]

RADIATION DOSE REDUCTION: This exam was performed according to the
departmental dose-optimization program which includes automated
exposure control, adjustment of the mA and/or kV according to
patient size and/or use of iterative reconstruction technique.

CONTRAST:  100mL OMNIPAQUE IOHEXOL 300 MG/ML  SOLN
FINDINGS: Bones/Joint/Cartilage

No evidence of acute fracture, dislocation, bone destruction or
erosive change. There are mild degenerative changes at the
acromioclavicular joint. Small nonspecific shoulder joint effusion
without suspicious synovial enhancement. The right clavicle and
scapula appear normal. The right sternoclavicular joint appears
normal. Mild cervical spondylosis without high-grade foraminal
narrowing.

Ligaments

Suboptimally assessed by CT.

Muscles and Tendons

The rotator cuff musculature appears normal, without atrophy, fluid
collection or abnormal enhancement.

Soft tissues

No periarticular fluid collection, foreign body or soft tissue
emphysema. The vascular structures appear unremarkable. No axillary
adenopathy.
IMPRESSION: 1. No acute osseous findings or evidence of shoulder joint infection
by CT. Small nonspecific shoulder joint effusion.
2. No evidence of periarticular fluid collection or inflammation.
3. The right shoulder muscles appear unremarkable.

## 2023-11-20 ENCOUNTER — Encounter (HOSPITAL_COMMUNITY): Payer: Self-pay

## 2023-11-20 ENCOUNTER — Emergency Department (HOSPITAL_COMMUNITY)

## 2023-11-20 ENCOUNTER — Emergency Department (HOSPITAL_COMMUNITY)
Admission: EM | Admit: 2023-11-20 | Discharge: 2023-11-21 | Discharge status: Against Medical Advice | Attending: Emergency Medicine | Admitting: Emergency Medicine

## 2023-11-20 ENCOUNTER — Other Ambulatory Visit: Payer: Self-pay

## 2023-11-20 DIAGNOSIS — R079 Chest pain, unspecified: Secondary | ICD-10-CM | POA: Diagnosis present

## 2023-11-20 DIAGNOSIS — R0602 Shortness of breath: Secondary | ICD-10-CM | POA: Diagnosis not present

## 2023-11-20 DIAGNOSIS — Z5321 Procedure and treatment not carried out due to patient leaving prior to being seen by health care provider: Secondary | ICD-10-CM | POA: Diagnosis not present

## 2023-11-20 LAB — BASIC METABOLIC PANEL WITH GFR
Anion gap: 10 (ref 5–15)
BUN: 9 mg/dL (ref 6–20)
CO2: 25 mmol/L (ref 22–32)
Calcium: 9.1 mg/dL (ref 8.9–10.3)
Chloride: 105 mmol/L (ref 98–111)
Creatinine, Ser: 0.92 mg/dL (ref 0.44–1.00)
GFR, Estimated: >60 mL/min (ref >60)
Glucose, Bld: 93 mg/dL (ref 70–99)
Potassium: 3.1 mmol/L — ABNORMAL LOW (ref 3.5–5.1)
Sodium: 140 mmol/L (ref 135–145)

## 2023-11-20 LAB — TROPONIN I (HIGH SENSITIVITY): Troponin I (High Sensitivity): 4 ng/L (ref <18)

## 2023-11-20 LAB — CBC
HCT: 48.3 % — ABNORMAL HIGH (ref 36.0–46.0)
Hemoglobin: 15.7 g/dL — ABNORMAL HIGH (ref 12.0–15.0)
MCH: 31.9 pg (ref 26.0–34.0)
MCHC: 32.5 g/dL (ref 30.0–36.0)
MCV: 98.2 fL (ref 80.0–100.0)
Platelets: 250 K/uL (ref 150–400)
RBC: 4.92 MIL/uL (ref 3.87–5.11)
RDW: 13.2 % (ref 11.5–15.5)
WBC: 7.6 K/uL (ref 4.0–10.5)
nRBC: 0 % (ref 0.0–0.2)

## 2023-11-20 LAB — HCG, SERUM, QUALITATIVE: Preg, Serum: NEGATIVE

## 2023-11-20 NOTE — ED Triage Notes (Signed)
 Chest pain that started 1 hour PTA, pain does not radiate. Denies nausea. C/o intermittent sob. Pt states she takes lisinopril and noticed her face being swollen when she woke up this AM.
